# Patient Record
Sex: Male | Born: 1995 | Race: Black or African American | Hispanic: No | Marital: Single
Health system: Midwestern US, Community
[De-identification: ages and names within clinical notes are randomized; demographics above are authoritative.]

---
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
## 2018-04-20 ENCOUNTER — Emergency Department (HOSPITAL_COMMUNITY)
Admission: EM | Admit: 2018-04-20 | Discharge: 2018-04-20 | Disposition: A | Discharge status: Home / Self Care | Payer: Medicaid Other | Attending: Emergency Medicine | Admitting: Emergency Medicine

## 2018-04-20 ENCOUNTER — Other Ambulatory Visit: Payer: Self-pay

## 2018-04-20 ENCOUNTER — Encounter (HOSPITAL_COMMUNITY): Payer: Self-pay

## 2018-04-20 DIAGNOSIS — F17210 Nicotine dependence, cigarettes, uncomplicated: Secondary | ICD-10-CM | POA: Diagnosis not present

## 2018-04-20 DIAGNOSIS — T401X1A Poisoning by heroin, accidental (unintentional), initial encounter: Secondary | ICD-10-CM | POA: Diagnosis not present

## 2018-04-20 DIAGNOSIS — T402X4A Poisoning by other opioids, undetermined, initial encounter: Secondary | ICD-10-CM | POA: Diagnosis present

## 2018-04-20 LAB — COMPREHENSIVE METABOLIC PANEL
ALBUMIN: 4.4 g/dL (ref 3.5–5.0)
ALT: 18 U/L (ref 17–63)
AST: 24 U/L (ref 15–41)
Alkaline Phosphatase: 45 U/L (ref 38–126)
Anion gap: 14 (ref 5–15)
BUN: 6 mg/dL (ref 6–20)
CHLORIDE: 100 mmol/L — AB (ref 101–111)
CO2: 25 mmol/L (ref 22–32)
CREATININE: 1.18 mg/dL (ref 0.61–1.24)
Calcium: 9.1 mg/dL (ref 8.9–10.3)
GFR calc Af Amer: >60 mL/min (ref >60)
GLUCOSE: 208 mg/dL — AB (ref 65–99)
POTASSIUM: 3.4 mmol/L — AB (ref 3.5–5.1)
Sodium: 139 mmol/L (ref 135–145)
Total Bilirubin: 1.2 mg/dL (ref 0.3–1.2)
Total Protein: 7.3 g/dL (ref 6.5–8.1)

## 2018-04-20 LAB — CBC
HEMATOCRIT: 44 % (ref 39.0–52.0)
Hemoglobin: 14 g/dL (ref 13.0–17.0)
MCH: 27.7 pg (ref 26.0–34.0)
MCHC: 31.8 g/dL (ref 30.0–36.0)
MCV: 87 fL (ref 78.0–100.0)
PLATELETS: 295 10*3/uL (ref 150–400)
RBC: 5.06 MIL/uL (ref 4.22–5.81)
RDW: 13.4 % (ref 11.5–15.5)
WBC: 10.6 10*3/uL — AB (ref 4.0–10.5)

## 2018-04-20 LAB — ETHANOL

## 2018-04-20 MED ORDER — NALOXONE HCL 0.4 MG/ML IJ SOLN
0.4000 mg | Freq: Once | INTRAMUSCULAR | Status: AC
  Administered 2018-04-20: 2 mg via INTRAVENOUS

## 2018-04-20 MED ORDER — POTASSIUM CHLORIDE CRYS ER 20 MEQ PO TBCR
40.0000 meq | EXTENDED_RELEASE_TABLET | Freq: Once | ORAL | Status: AC
  Administered 2018-04-20: 40 meq via ORAL
  Filled 2018-04-20: qty 2

## 2018-04-20 MED ORDER — NALOXONE HCL 4 MG/0.1ML NA LIQD
1.0000 | Freq: Once | NASAL | Status: DC
Start: 2018-04-20 — End: 2018-04-20

## 2018-04-20 MED ORDER — NALOXONE HCL 4 MG/0.1ML NA LIQD: NASAL | 1 refills | Status: AC

## 2018-04-20 NOTE — ED Notes (Signed)
Bed: WA15 Expected date:  Expected time:  Means of arrival:  Comments: ResB 

## 2018-04-20 NOTE — Discharge Instructions (Addendum)
Avoid any heroin or other drug use - use of heroin/drugs can cause death.   In event heroin overdose, use narcan kit per instructions.  Follow up with NA and use resource guide provided.  From today's labs, your potassium level is slightly low - eat plenty of fruits and vegetables. Follow up with primary care doctor.   Return to ER if worse, new symptoms, other concern.

## 2018-04-20 NOTE — ED Provider Notes (Addendum)
[place] DEPT Provider Note   CSN: [id number] Arrival date & time: 04/20/18  1718     History   Chief Complaint Chief Complaint  Patient presents with  . Drug Overdose    Heroin    HPI [name] Weaver is a 22 y.o. male.  Patient arrives with suspected opiate overdose - pt unresponsive on arrival to ED - level 5 caveat.  Pt apneic, pinpoint pupils. No report of trauma or injury.   The history is provided by the patient. The history is limited by the condition of the patient.  Drug Overdose     History reviewed. No pertinent past medical history.  Patient Active Problem List   Diagnosis Date Noted  . Appendicitis, acute 04/01/2016  . Vitamin D deficiency 12/31/2015  . Elevated cholesterol 12/31/2015  . Pre-diabetes 12/31/2015  . Obesity, pediatric, BMI 95th to 98th percentile for age 89/04/2016    Past Surgical History:  Procedure Laterality Date  . LAPAROSCOPIC APPENDECTOMY N/A 04/01/2016   Procedure: APPENDECTOMY LAPAROSCOPIC;  Surgeon: Gerald Stabs, MD;  Location: [facility];  Service: Pediatrics;  Laterality: N/A;        Home Medications    Prior to Admission medications   Medication Sig Start Date End Date Taking? Authorizing Provider  Cholecalciferol 2000 units CAPS Take 1 capsule (2,000 Units total) by mouth daily. Patient not taking: Reported on 04/01/2016 01/25/16   Ok Edwards, MD  HYDROcodone-acetaminophen (NORCO/VICODIN) 5-325 MG tablet Take 1.5 tablets by mouth every 6 (six) hours as needed for moderate pain. 04/02/16   Gerald Stabs, MD  naloxone [facility]) nasal spray 4 mg/0.1 mL Use as directed in event opiate/heroin overdose 04/20/18   Lajean Saver, MD    Family History Family History  Problem Relation Age of Onset  . Hypertension Father     Social History Social History   Tobacco Use  . Smoking status: Current Every Day Smoker    Packs/day: 0.50    Years: 5.00    Pack years: 2.50    Types: Cigarettes  .  Smokeless tobacco: Never Used  Substance Use Topics  . Alcohol use: Not Currently    Alcohol/week: 0.0 oz  . Drug use: Not Currently    Comment: Pt was narcaned however patient denies any Drug use.     Allergies   Patient has no known allergies.   Review of Systems Review of Systems  Unable to perform ROS: Patient unresponsive  level 5 caveat  - pt unresponsive.    Physical Exam Updated Vital Signs BP 130/80   Pulse (!) 109   Resp 19   Ht 1.905 m ('6\' 3"'$ )   Wt 86.2 kg (190 lb)   SpO2 99%   BMI 23.75 kg/m   Physical Exam  Constitutional: He appears well-developed and well-nourished. He appears distressed.  Apneic, pinpoint pupils.   HENT:  Mouth/Throat: Oropharynx is clear and moist.  Eyes: Conjunctivae are normal.  Miotic pupils.   Neck: Neck supple. No tracheal deviation present.  Cardiovascular: Regular rhythm, normal heart sounds and intact distal pulses. Exam reveals no gallop and no friction rub.  No murmur heard. Pulmonary/Chest: No accessory muscle usage.  Apneic.   Abdominal: Soft. Bowel sounds are normal. He exhibits no distension.  Genitourinary:  Genitourinary Comments: Normal external gu exam.   Musculoskeletal: He exhibits no edema.  CTLS spine, non tender, aligned, no step off. Good rom bil extremities without pain or focal bony tenderness.   Neurological:  Unresponsive. Pinpoint pupils. Apneic.  Skin: Skin is warm and dry. No rash noted.  Psychiatric:  Unresponsive.   Nursing note and vitals reviewed.    ED Treatments / Results  Labs (all labs ordered are listed, but only abnormal results are displayed) Results for orders placed or performed during the hospital encounter of 04/20/18  CBC  Result Value Ref Range   WBC 10.6 (H) 4.0 - 10.5 K/uL   RBC 5.06 4.22 - 5.81 MIL/uL   Hemoglobin 14.0 13.0 - 17.0 g/dL   HCT 44.0 39.0 - 52.0 %   MCV 87.0 78.0 - 100.0 fL   MCH 27.7 26.0 - 34.0 pg   MCHC 31.8 30.0 - 36.0 g/dL   RDW 13.4 11.5 - 15.5  %   Platelets 295 150 - 400 K/uL  Comprehensive metabolic panel  Result Value Ref Range   Sodium 139 135 - 145 mmol/L   Potassium 3.4 (L) 3.5 - 5.1 mmol/L   Chloride 100 (L) 101 - 111 mmol/L   CO2 25 22 - 32 mmol/L   Glucose, Bld 208 (H) 65 - 99 mg/dL   BUN 6 6 - 20 mg/dL   Creatinine, Ser 1.18 0.61 - 1.24 mg/dL   Calcium 9.1 8.9 - 10.3 mg/dL   Total Protein 7.3 6.5 - 8.1 g/dL   Albumin 4.4 3.5 - 5.0 g/dL   AST 24 15 - 41 U/L   ALT 18 17 - 63 U/L   Alkaline Phosphatase 45 38 - 126 U/L   Total Bilirubin 1.2 0.3 - 1.2 mg/dL   GFR calc non Af Amer >60 >60 mL/min   GFR calc Af Amer >60 >60 mL/min   Anion gap 14 5 - 15  Ethanol  Result Value Ref Range   Alcohol, Ethyl (B) <10 <10 mg/dL   EKG EKG Interpretation  Date/Time:  Sunday April 20 2018 17:21:56 EDT Ventricular Rate:  125 PR Interval:    QRS Duration: 94 QT Interval:  321 QTC Calculation: 463 R Axis:   69 Text Interpretation:  Sinus tachycardia No previous tracing Confirmed by Lajean Saver [phone]) on 04/20/2018 6:31:44 PM   Radiology No results found.  Procedures Procedures (including critical care time)  Medications Ordered in ED Medications  naloxone (NARCAN) nasal spray 4 mg/0.1 mL (0 sprays Nasal Hold 04/20/18 1733)  naloxone (NARCAN) injection 0.4 mg (2 mg Intravenous Given by Other 04/20/18 1716)     Initial Impression / Assessment and Plan / ED Course  I have reviewed the triage vital signs and the nursing notes.  Pertinent labs & imaging results that were available during my care of the patient were reviewed by me and considered in my medical decision making (see chart for details).  Iv ns. Continuous pulse ox and monitor.   Bag assisted ventilations/100% mask.   Respiratory therapy consulted.   Narcan iv.   Stat labs.   Reviewed nursing notes and prior charts for additional history.   Immediately upon receiving narcan, pt with return of respiratory effort and consciousness.   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 States accidental overdose, denies any thoughts of harm to self.   Pt denies any other ingestion.   Continuous monitoring, continuous pulse ox. Neurochecks.  Home narcan kit ordered from pharmacy.   Labs reviewed - k low, kcl po.  Reassessment, no resp depression. Fully alert, awake. Reaffirms no attempt to harm self, no depression. Pt states he normally does not use drugs and that it was stupid for him to try.  Pt currently appears stable for d/c.   CRITICAL CARE  RE: unintentional heroin overdose with severe alteration of mental status/unconsciousness and apnea.  Performed by: Mirna Mires Total critical care time: 35 minutes Critical care time was exclusive of separately billable procedures and treating other patients. Critical care was necessary to treat or prevent imminent or life-threatening deterioration. Critical care was time spent personally by me on the following activities: development of treatment plan with patient and/or surrogate as well as nursing, discussions with consultants, evaluation of patient's response to treatment, examination of patient, obtaining history from patient or surrogate, ordering and performing treatments and interventions, ordering and review of laboratory studies, ordering and review of radiographic studies, pulse oximetry and re-evaluation of patient's condition.  2 hours post narcan remains fully alert, no resp dep, pulse ox 99%. No c/o.    Final Clinical Impressions(s) / ED Diagnoses   Final diagnoses:  None    ED Discharge Orders        Ordered    naloxone [facility]) nasal spray 4 mg/0.1 mL     04/20/18 1723          Lajean Saver, MD 04/20/18 [place]

## 2018-04-20 NOTE — ED Notes (Signed)
Patient is to not have any visitors. Two males of similar age to the patient somehow got back to patient in RES B and were escorted out by [employer] and [employer] desk was informed to not allow any visitors back at this time until further notice.

## 2018-04-20 NOTE — ED Notes (Signed)
He awoke immediately after receiving IV Narcan. He remains very much awake, alert and oriented x 4 with clear speech.

## 2018-04-20 NOTE — ED Triage Notes (Signed)
Pt was brought to ED via POV with 2 additional males of similar age. Pt was unresponsive with pulse and was bagged for 3-5 minutes while Narcan was being pulled. Pt came to and stated that he snorted heroin. Pt is now alert and only oriented to self at this time.

## 2018-04-21 LAB — CBG MONITORING, ED: GLUCOSE-CAPILLARY: 196 mg/dL — AB (ref 65–99)

## 2018-11-09 ENCOUNTER — Emergency Department (HOSPITAL_COMMUNITY)
Admission: EM | Admit: 2018-11-09 | Discharge: 2018-11-09 | Disposition: A | Discharge status: Home / Self Care | Payer: Medicaid Other | Attending: Emergency Medicine | Admitting: Emergency Medicine

## 2018-11-09 ENCOUNTER — Encounter (HOSPITAL_COMMUNITY): Payer: Self-pay | Admitting: Emergency Medicine

## 2018-11-09 ENCOUNTER — Other Ambulatory Visit: Payer: Self-pay

## 2018-11-09 DIAGNOSIS — F17210 Nicotine dependence, cigarettes, uncomplicated: Secondary | ICD-10-CM | POA: Insufficient documentation

## 2018-11-09 DIAGNOSIS — K5900 Constipation, unspecified: Secondary | ICD-10-CM | POA: Diagnosis present

## 2018-11-09 MED ORDER — FLEET ENEMA 7-19 GM/118ML RE ENEM
1.0000 | ENEMA | Freq: Once | RECTAL | Status: AC
  Administered 2018-11-09: 1 via RECTAL
  Filled 2018-11-09: qty 1

## 2018-11-09 MED ORDER — PSYLLIUM 0.52 G PO CAPS: 0.5200 g | ORAL_CAPSULE | Freq: Every day | ORAL | 0 refills | Status: AC

## 2018-11-09 NOTE — ED Provider Notes (Signed)
[facility]   CSN: [id number] Arrival date & time: 11/09/18  1419     History   Chief Complaint Chief Complaint  Patient presents with  . Constipation    HPI [name] Weaver is a 22 y.o. male who presents to the ED for constipation. Patient reports normal BMs until 2 weeks ago. Two weeks ago when he had a BM it was hard and painful. Patient reports using a fleet enema at home without results but did start having lower abdominal cramping so he came to the ED. Patient denies, fever, chills, vomiting or other problems.   The history is provided by the patient. No language interpreter was used.  Constipation   This is a new problem. The current episode started more than 1 week ago. The stool is described as pellet like. Associated symptoms include abdominal pain. Pertinent negatives include no dysuria. There is no fiber in the patient's diet. There has not been adequate water intake.    History reviewed. No pertinent past medical history.  Patient Active Problem List   Diagnosis Date Noted  . Appendicitis, acute 04/01/2016  . Vitamin D deficiency 12/31/2015  . Elevated cholesterol 12/31/2015  . Pre-diabetes 12/31/2015  . Obesity, pediatric, BMI 95th to 98th percentile for age 31/04/2016    Past Surgical History:  Procedure Laterality Date  . LAPAROSCOPIC APPENDECTOMY N/A 04/01/2016   Procedure: APPENDECTOMY LAPAROSCOPIC;  Surgeon: Leonia CoronaShuaib Farooqui, MD;  Location: [facility] OR;  Service: Pediatrics;  Laterality: N/A;        Home Medications    Prior to Admission medications   Medication Sig Start Date End Date Taking? Authorizing Provider  naloxone [facility](NARCAN) nasal spray 4 mg/0.1 mL Use as directed in event opiate/heroin overdose 04/20/18   Cathren LaineSteinl, Kevin, MD  psyllium (METAMUCIL) 0.52 g capsule Take 1 capsule (0.52 g total) by mouth daily. 11/09/18   Janne NapoleonNeese, Hope M, NP    Family History Family History  Problem Relation Age of Onset    . Hypertension Father     Social History Social History   Tobacco Use  . Smoking status: Current Every Day Smoker    Packs/day: 0.50    Years: 5.00    Pack years: 2.50    Types: Cigarettes  . Smokeless tobacco: Never Used  Substance Use Topics  . Alcohol use: Not Currently    Alcohol/week: 0.0 standard drinks  . Drug use: Not Currently    Comment: Pt was narcaned however patient denies any Drug use.     Allergies   Patient has no known allergies.   Review of Systems Review of Systems  Constitutional: Negative for chills and fever.  HENT: Negative.   Respiratory: Negative for shortness of breath.   Cardiovascular: Negative for leg swelling.  Gastrointestinal: Positive for abdominal pain and constipation. Negative for vomiting.  Genitourinary: Negative for dysuria.  Musculoskeletal: Negative for back pain.  Skin: Negative for rash.  Neurological: Negative for syncope and headaches.  Psychiatric/Behavioral: Negative for confusion.     Physical Exam Updated Vital Signs BP 112/78 (BP Location: Left Arm)   Pulse (!) 105   Temp 98 F (36.7 C) (Oral)   Resp 16   Ht 6\' 3"  (1.905 m)   Wt 79.4 kg   SpO2 100%   BMI 21.87 kg/m   Physical Exam Vitals signs and nursing note reviewed. Exam conducted with a chaperone present.  Constitutional:      General: He is not in acute distress.  Appearance: He is well-developed.  HENT:     Head: Normocephalic.     Nose: Nose normal.  Eyes:     Conjunctiva/sclera: Conjunctivae normal.  Neck:     Musculoskeletal: Neck supple.  Cardiovascular:     Rate and Rhythm: Tachycardia present.  Pulmonary:     Effort: Pulmonary effort is normal.  Abdominal:     General: Bowel sounds are normal.     Palpations: Abdomen is soft.     Tenderness: There is no abdominal tenderness.  Genitourinary:    Rectum: No tenderness, external hemorrhoid or internal hemorrhoid. Normal anal tone.     Comments: Hard stool palpated on rectal  exam Musculoskeletal: Normal range of motion.  Skin:    General: Skin is warm and dry.  Neurological:     Mental Status: He is alert and oriented to person, place, and time.  Psychiatric:        Mood and Affect: Mood normal.      ED Treatments / Results  Labs (all labs ordered are listed, but only abnormal results are displayed) Labs Reviewed - No data to display  Radiology No results found.  Procedures Procedures (including critical care time)  Medications Ordered in ED Medications  sodium phosphate (FLEET) 7-19 GM/118ML enema 1 enema (1 enema Rectal Given 11/09/18 1526)   After enema patient had good results. He used the bedside commode and fill it up and then went to the bathroom to finish. Patient reports feeling much better. Re examined and abdomen is soft and nontender.  Discussed high fiber diet and stool softener. Patient appears stable for d/c.   Initial Impression / Assessment and Plan / ED Course  I have reviewed the triage vital signs and the nursing notes.  Final Clinical Impressions(s) / ED Diagnoses   Final diagnoses:  Constipation, unspecified constipation type    ED Discharge Orders         Ordered    psyllium (METAMUCIL) 0.52 g capsule  Daily     11/09/18 1600           Damian Leavelleese, Clifton ForgeHope M, [place]NP 11/09/18 1628    Doug SouJacubowitz, Sam, MD 11/09/18 1650

## 2018-11-09 NOTE — ED Notes (Signed)
Pt. Stated "in a lot of pain, cannot lay down due to pain"

## 2018-11-09 NOTE — ED Triage Notes (Signed)
Pt reports no BM in 2 weeks, reports fresh bleeding with new rectal pain when self-administered enema this am. Pt c/o nausea, denies vomiting.

## 2018-11-09 NOTE — Discharge Instructions (Signed)
Be sure you are drinking 8 glasses of water per day and eating a high fiber diet to prevent constipation

## 2022-10-12 DIAGNOSIS — S0990XA Unspecified injury of head, initial encounter: Secondary | ICD-10-CM

## 2022-10-12 NOTE — ED Provider Notes (Signed)
[facility] EMERGENCY DEPT  EMERGENCY DEPARTMENT ENCOUNTER      Pt Name: Adrian Valencia  MRN: [id number]  Birthdate 1996-06-20  Date of evaluation: 10/12/2022  Provider: Paulita Fujita, PA-C    CHIEF COMPLAINT       Chief Complaint   Patient presents with    Head Injury     Pt fell coming off a ladder at work and struck his head on one of the metal bars. Ground level fall, denies LOC. C/o HA, no signs of injury          HISTORY OF PRESENT ILLNESS    The history is provided by the patient.       Nursing Notes were reviewed.    Patient is a healthy 26 year old that presents for head injury.  Patient works at [employer] he was going down a ladder when he missed the last rung which caused him to fall forward.  He states he hit his forehead on a run.  There is no loss of consciousness.  He is not anticoagulated.  He complains of some mild head pain over the forehead area.  No nausea or vomiting reported.  No other injuries from this incident.  He denies any ataxia, numbness tingling or weakness of the extremities, facial droop, slurred speech.  No medication prior to arrival.    REVIEW OF SYSTEMS       Review of Systems   Constitutional:  Negative for chills and fever.   HENT:  Negative for sore throat.    Eyes:  Negative for pain and visual disturbance.   Respiratory:  Negative for cough and shortness of breath.    Cardiovascular:  Negative for chest pain, palpitations and leg swelling.   Gastrointestinal:  Negative for abdominal pain, constipation, diarrhea, nausea and vomiting.   Endocrine: Negative for polydipsia and polyuria.   Genitourinary:  Negative for dysuria and hematuria.   Musculoskeletal:  Negative for arthralgias, back pain and myalgias.   Skin:  Negative for rash and wound.   Allergic/Immunologic: Negative for immunocompromised state.   Neurological:  Positive for headaches. Negative for dizziness, tremors, seizures, syncope, facial asymmetry, speech difficulty, weakness, light-headedness and numbness.    Hematological:  Negative for adenopathy.   Psychiatric/Behavioral:  Negative for confusion.        Except as noted above the remainder of the review of systems was reviewed and negative.       PAST MEDICAL HISTORY   No past medical history on file.    SURGICAL HISTORY     No past surgical history on file.    CURRENT MEDICATIONS       Previous Medications    No medications on file       ALLERGIES     Patient has no known allergies.    FAMILY HISTORY     No family history on file.     SOCIAL HISTORY       Social History     Socioeconomic History    Marital status: Single       SCREENINGS         Glasgow Coma Scale  Eye Opening: Spontaneous  Best Verbal Response: Oriented  Best Motor Response: Obeys commands  Glasgow Coma Scale Score: 15                     CIWA Assessment  BP: 125/80  Pulse: 80  PHYSICAL EXAM         ED Triage Vitals   BP Temp Temp Source Pulse Respirations SpO2 Height Weight - Scale   10/12/22 2226 10/12/22 2226 10/12/22 2226 10/12/22 2226 10/12/22 2226 10/12/22 2226 10/12/22 2224 10/12/22 2224   125/80 98.1 F (36.7 C) Oral 80 16 99 % 1.9 m (6' 2.8") 80 kg (176 lb 5.9 oz)       Physical Exam  Vitals and nursing note reviewed.   Constitutional:       Appearance: Normal appearance. He is normal weight.   HENT:      Head: Normocephalic and atraumatic.      Right Ear: Tympanic membrane, ear canal and external ear normal.      Left Ear: Tympanic membrane, ear canal and external ear normal.      Nose: Nose normal.      Mouth/Throat:      Mouth: Mucous membranes are moist.      Pharynx: Oropharynx is clear.   Eyes:      Conjunctiva/sclera: Conjunctivae normal.   Cardiovascular:      Rate and Rhythm: Normal rate and regular rhythm.      Pulses: Normal pulses.      Heart sounds: Normal heart sounds.   Pulmonary:      Effort: Pulmonary effort is normal.      Breath sounds: Normal breath sounds.   Abdominal:      General: There is no distension.   Musculoskeletal:         General: Normal  range of motion.      Cervical back: Normal range of motion and neck supple.   Skin:     General: Skin is warm and dry.      Capillary Refill: Capillary refill takes less than 2 seconds.   Neurological:      General: No focal deficit present.      Mental Status: He is alert and oriented to person, place, and time. Mental status is at baseline.      Cranial Nerves: No cranial nerve deficit.      Sensory: No sensory deficit.      Motor: No weakness.      Coordination: Coordination normal. Finger-Nose-Finger Test and Heel to Shin Test normal.      Gait: Gait normal.   Psychiatric:         Mood and Affect: Mood normal.         Behavior: Behavior normal.         Thought Content: Thought content normal.         Judgment: Judgment normal.         DIAGNOSTIC RESULTS   PROCEDURES:  Unless otherwise noted below, none     Procedures    EKG: All EKG's are interpreted by the Emergency Department Physician who either signs or Co-signs this chart in the absence of a cardiologist.      RADIOLOGY:   Non-plain film images such as CT, Ultrasound and MRI are read by the radiologist. Plain radiographic images are visualized and preliminarily interpreted by the emergency physician with the below findings:    Interpretation per the Radiologist below, if available at the time of this note:    No orders to display           LABS:  Labs Reviewed - No data to display    All other labs were within normal range or not returned as of this dictation.    EMERGENCY DEPARTMENT  COURSE/REASSESSMENT and MDM:   Vitals:    Vitals:    10/12/22 2224 10/12/22 2226   BP:  125/80   Pulse:  80   Resp:  16   Temp:  98.1 F (36.7 C)   TempSrc:  Oral   SpO2:  99%   Weight: 80 kg (176 lb 5.9 oz)    Height: 1.9 m (6' 2.8")        ED Course:       MDM  Number of Diagnoses or Management Options  Diagnosis management comments: Shared decision making made with the patient.  Patient was going on a ladder when he missed the last step hitting his forehead on a rung of  the ladder.  He has no focal neurological deficits.  There is no loss of consciousness and he is not anticoagulated.  He has a completely normal neuroexam.  Discussed CAT scan.  Patient declines at this time and states he will treat with Tylenol over the next 24 hours.  Patient does agree to return immediately if he has any worsening symptoms.  Patient educated on concussion and "brain rest".        CONSULTS:  None    FINAL IMPRESSION      1. Injury of head, initial encounter          DISPOSITION/PLAN   DISPOSITION Decision To Discharge 10/12/2022 10:47:43 PM      PATIENT REFERRED TO:  No follow-up provider specified.    DISCHARGE MEDICATIONS:  New Prescriptions    No medications on file         (Please note that portions of this note were completed with a voice recognition program.  Efforts were made to edit the dictations but occasionally words are mis-transcribed.)    Paulita Fujita, PA-C (electronically signed)  Attending Emergency Physician           Paulita Fujita, PA-C  10/12/22 2248

## 2022-10-12 NOTE — Discharge Instructions (Signed)
Follow-up with your Workmen's Comp.  Only use Tylenol for the next 24 hours.  After that you can start using Motrin and or ibuprofen for an anti-inflammatory.  If you develop any new or worsening symptoms you can return immediately.

## 2022-10-13 ENCOUNTER — Inpatient Hospital Stay
Admit: 2022-10-13 | Discharge: 2022-10-13 | Disposition: A | Discharge status: Home / Self Care | Payer: PRIVATE HEALTH INSURANCE

## 2022-10-13 MED ORDER — ACETAMINOPHEN 500 MG PO TABS
500 MG | ORAL | Status: AC
Start: 2022-10-13 — End: 2022-10-12
  Administered 2022-10-13: 04:00:00 1000 mg via ORAL

## 2022-10-13 MED FILL — ACETAMINOPHEN EXTRA STRENGTH 500 MG PO TABS: 500 MG | ORAL | Qty: 2

## 2023-08-24 ENCOUNTER — Emergency Department (HOSPITAL_COMMUNITY)
Admission: EM | Admit: 2023-08-24 | Discharge: 2023-08-24 | Disposition: A | Discharge status: Home / Self Care | Payer: BLUE CROSS/BLUE SHIELD | Attending: Emergency Medicine | Admitting: Emergency Medicine

## 2023-08-24 ENCOUNTER — Emergency Department (HOSPITAL_COMMUNITY): Payer: BLUE CROSS/BLUE SHIELD

## 2023-08-24 ENCOUNTER — Other Ambulatory Visit: Payer: Self-pay

## 2023-08-24 ENCOUNTER — Encounter (HOSPITAL_COMMUNITY): Payer: Self-pay

## 2023-08-24 DIAGNOSIS — R059 Cough, unspecified: Secondary | ICD-10-CM | POA: Diagnosis present

## 2023-08-24 DIAGNOSIS — Z1152 Encounter for screening for COVID-19: Secondary | ICD-10-CM | POA: Diagnosis not present

## 2023-08-24 DIAGNOSIS — J40 Bronchitis, not specified as acute or chronic: Secondary | ICD-10-CM | POA: Insufficient documentation

## 2023-08-24 LAB — BASIC METABOLIC PANEL
Anion gap: 11 (ref 5–15)
BUN: 7 mg/dL (ref 6–20)
CO2: 29 mmol/L (ref 22–32)
Calcium: 9.3 mg/dL (ref 8.9–10.3)
Chloride: 96 mmol/L — ABNORMAL LOW (ref 98–111)
Creatinine, Ser: 0.95 mg/dL (ref 0.61–1.24)
GFR, Estimated: >60 mL/min (ref >60)
Glucose, Bld: 99 mg/dL (ref 70–99)
Potassium: 3.5 mmol/L (ref 3.5–5.1)
Sodium: 136 mmol/L (ref 135–145)

## 2023-08-24 LAB — CBC WITH DIFFERENTIAL/PLATELET
Abs Immature Granulocytes: 0.02 10*3/uL (ref 0.00–0.07)
Basophils Absolute: 0 10*3/uL (ref 0.0–0.1)
Basophils Relative: 0 %
Eosinophils Absolute: 0 10*3/uL (ref 0.0–0.5)
Eosinophils Relative: 1 %
HCT: 42.1 % (ref 39.0–52.0)
Hemoglobin: 13.3 g/dL (ref 13.0–17.0)
Immature Granulocytes: 0 %
Lymphocytes Relative: 19 %
Lymphs Abs: 1.7 10*3/uL (ref 0.7–4.0)
MCH: 27.4 pg (ref 26.0–34.0)
MCHC: 31.6 g/dL (ref 30.0–36.0)
MCV: 86.6 fL (ref 80.0–100.0)
Monocytes Absolute: 0.8 10*3/uL (ref 0.1–1.0)
Monocytes Relative: 9 %
Neutro Abs: 6.3 10*3/uL (ref 1.7–7.7)
Neutrophils Relative %: 71 %
Platelets: 255 10*3/uL (ref 150–400)
RBC: 4.86 MIL/uL (ref 4.22–5.81)
RDW: 14.1 % (ref 11.5–15.5)
WBC: 8.8 10*3/uL (ref 4.0–10.5)
nRBC: 0 % (ref 0.0–0.2)

## 2023-08-24 LAB — GROUP A STREP BY PCR: Group A Strep by PCR: NOT DETECTED

## 2023-08-24 LAB — RESP PANEL BY RT-PCR (RSV, FLU A&B, COVID)  RVPGX2
Influenza A by PCR: NEGATIVE
Influenza B by PCR: NEGATIVE
Resp Syncytial Virus by PCR: NEGATIVE
SARS Coronavirus 2 by RT PCR: NEGATIVE

## 2023-08-24 LAB — D-DIMER, QUANTITATIVE: D-Dimer, Quant: <0.27 ug{FEU}/mL (ref 0.00–0.50)

## 2023-08-24 LAB — TROPONIN I (HIGH SENSITIVITY): Troponin I (High Sensitivity): 6 ng/L (ref <18)

## 2023-08-24 MED ORDER — AZITHROMYCIN 250 MG PO TABS: 250.0000 mg | ORAL_TABLET | Freq: Every day | ORAL | 0 refills | Status: AC

## 2023-08-24 MED ORDER — PREDNISONE 20 MG PO TABS: 40.0000 mg | ORAL_TABLET | Freq: Every day | ORAL | 0 refills | Status: AC

## 2023-08-24 MED ORDER — ALBUTEROL SULFATE HFA 108 (90 BASE) MCG/ACT IN AERS
1.0000 | INHALATION_SPRAY | Freq: Once | RESPIRATORY_TRACT | Status: AC
  Administered 2023-08-24: 1 via RESPIRATORY_TRACT
  Filled 2023-08-24: qty 6.7

## 2023-08-24 NOTE — ED Triage Notes (Addendum)
Pt complaining of SOB with exertion, and coughing that began in the last 24 hrs. Pt notes some blood streaked phlegm this morning, and endorses diarrhea and pain in chest/throat with coughing. Hx of asthma when he was young.

## 2023-08-24 NOTE — Discharge Instructions (Addendum)
Take antibiotics and steroids as prescribed.  Return if symptoms worsen.  Follow-up with primary care doctor.

## 2023-08-24 NOTE — ED Provider Notes (Signed)
[name] Weaver Provider Note   CSN: [id number] Arrival date & time: 08/24/23  1025     History  Chief Complaint  Patient presents with   Shortness of Breath    [name] Weaver is a 27 y.o. male.  Is here with cough and some sputum production.  Had a little bit of blood in the sputum but only 1 episode.  Denies any chest pain shortness of breath currently.  No abdominal pain nausea vomit diarrhea.  No significant medical history.  Patient does smoke daily.  Asthma as a child.  Denies any abdominal pain nausea vomiting diarrhea.  The history is provided by the patient.       Home Medications Prior to Admission medications   Medication Sig Start Date End Date Taking? Authorizing Provider  azithromycin (ZITHROMAX) 250 MG tablet Take 1 tablet (250 mg total) by mouth daily. Take first 2 tablets together, then 1 every day until finished. 08/24/23  Yes Tayjah Lobdell, DO  predniSONE (DELTASONE) 20 MG tablet Take 2 tablets (40 mg total) by mouth daily for 5 days. 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 11/09/18   Janne Napoleon, NP      Allergies    Patient has no known allergies.    Review of Systems   Review of Systems  Physical Exam Updated Vital Signs BP (!) 146/89 (BP Location: Left Arm)   Pulse 100   Temp 99.1 F (37.3 C) (Oral)   Resp 16   Ht 6\' 4"  (1.93 m)   Wt 86.6 kg   SpO2 100%   BMI 23.25 kg/m  Physical Exam Vitals and nursing note reviewed.  Constitutional:      General: He is not in acute distress.    Appearance: He is well-developed.  HENT:     Head: Normocephalic and atraumatic.  Eyes:     Extraocular Movements: Extraocular movements intact.     Conjunctiva/sclera: Conjunctivae normal.     Pupils: Pupils are equal,  round, and reactive to light.  Cardiovascular:     Rate and Rhythm: Normal rate and regular rhythm.     Pulses: Normal pulses.     Heart sounds: Normal heart sounds. No murmur heard. Pulmonary:     Effort: Pulmonary effort is normal. No respiratory distress.     Breath sounds: Normal breath sounds. No decreased breath sounds or wheezing.  Abdominal:     Palpations: Abdomen is soft.     Tenderness: There is no abdominal tenderness.  Musculoskeletal:        General: No swelling. Normal range of motion.     Cervical back: Normal range of motion and neck supple.     Right lower leg: No edema.     Left lower leg: No edema.  Skin:    General: Skin is warm and dry.     Capillary Refill: Capillary refill takes less than 2 seconds.  Neurological:     General: No focal deficit present.     Mental Status: He is alert.  Psychiatric:        Mood and Affect: Mood normal.     ED Results / Procedures / Treatments   Labs (all labs ordered are listed, but only abnormal results are displayed) Labs Reviewed  BASIC  METABOLIC PANEL - Abnormal; Notable for the following components:      Result Value   Chloride 96 (*)    All other components within normal limits  GROUP A STREP BY PCR  RESP PANEL BY RT-PCR (RSV, FLU A&B, COVID)  [id number]  D-DIMER, QUANTITATIVE  CBC WITH DIFFERENTIAL/PLATELET  TROPONIN I (HIGH SENSITIVITY)    EKG EKG Interpretation Date/Time:  Saturday August 24 2023 10:38:18 EDT Ventricular Rate:  101 PR Interval:  152 QRS Duration:  88 QT Interval:  321 QTC Calculation: 416 R Axis:   55  Text Interpretation: Sinus tachycardia Confirmed by Virgina Norfolk [phone]) on 08/24/2023 11:21:36 AM  Radiology DG Chest 2 View  Result Date: 08/24/2023 CLINICAL DATA:  Shortness of breath and coughing for 24 hours. EXAM: CHEST - 2 VIEW COMPARISON:  None Available. FINDINGS: The heart size and mediastinal contours are within normal limits. Both lungs are clear. The visualized skeletal  structures are unremarkable. IMPRESSION: No active cardiopulmonary disease. Electronically Signed   By: Danae Orleans M.D.   On: 08/24/2023 11:15    Procedures Procedures    Medications Ordered in ED Medications  albuterol (VENTOLIN HFA) 108 (90 Base) MCG/ACT inhaler 1 puff (has no administration in time range)    ED Course/ Medical Decision Making/ A&P                                 Medical Decision Making Amount and/or Complexity of Data Reviewed Labs: ordered. Radiology: ordered.  Risk Prescription drug management.   [name] Weaver is here with cough shortness of breath.  Normal vitals.  No fever.  Overall well-appearing.  Symptoms overall improved.  Differential diagnosis likely bronchitis versus less likely pneumonia, ACS, PE viral process.  BC, BMP, D-dimer, strep, COVID and flu test, chest x-ray.  Chest x-ray per my review and interpretation shows no evidence of pneumonia or pneumothorax.  EKG shows sinus rhythm.  No ischemic changes.  Troponin normal.  D-dimer normal.  No significant anemia electrolyte abnormality kidney injury or leukocytosis.  Overall no concern for ACS or PE or pneumonia but will treat for bronchitis with Z-Pak and steroids and inhaler.  Understands return precautions.  Discharged in good condition.  This chart was dictated using voice recognition software.  Despite best efforts to proofread,  errors can occur which can change the documentation meaning.         Final Clinical Impression(s) / ED Diagnoses Final diagnoses:  Bronchitis    Rx / DC Orders ED Discharge Orders          Ordered    azithromycin (ZITHROMAX) 250 MG tablet  Daily        08/24/23 1433    predniSONE (DELTASONE) 20 MG tablet  Daily        08/24/23 1433              Virgina Norfolk, DO 08/24/23 1443
